# Patient Record
Sex: Female | Born: 1960 | Race: White | Hispanic: No | Marital: Married | Smoking: Never smoker
Health system: Southern US, Community
[De-identification: ages and names within clinical notes are randomized; demographics above are authoritative.]

## PROBLEM LIST (undated history)

## (undated) DIAGNOSIS — J45909 Unspecified asthma, uncomplicated: Secondary | ICD-10-CM

## (undated) DIAGNOSIS — I10 Essential (primary) hypertension: Secondary | ICD-10-CM

## (undated) DIAGNOSIS — K219 Gastro-esophageal reflux disease without esophagitis: Secondary | ICD-10-CM

## (undated) HISTORY — DX: Essential (primary) hypertension: I10

## (undated) HISTORY — PX: COLONOSCOPY: SHX174

## (undated) HISTORY — DX: Unspecified asthma, uncomplicated: J45.909

---

## 2014-05-07 ENCOUNTER — Emergency Department: Payer: Self-pay | Admitting: Internal Medicine

## 2014-05-07 LAB — CBC WITH DIFFERENTIAL/PLATELET
BASOS PCT: 1.2 %
Basophil #: 0.1 10*3/uL (ref 0.0–0.1)
EOS PCT: 2.3 %
Eosinophil #: 0.2 10*3/uL (ref 0.0–0.7)
HCT: 39.8 % (ref 35.0–47.0)
HGB: 13.4 g/dL (ref 12.0–16.0)
LYMPHS ABS: 2.7 10*3/uL (ref 1.0–3.6)
LYMPHS PCT: 29.3 %
MCH: 28.9 pg (ref 26.0–34.0)
MCHC: 33.6 g/dL (ref 32.0–36.0)
MCV: 86 fL (ref 80–100)
Monocyte #: 0.7 x10 3/mm (ref 0.2–0.9)
Monocyte %: 7.7 %
NEUTROS ABS: 5.6 10*3/uL (ref 1.4–6.5)
Neutrophil %: 59.5 %
Platelet: 299 10*3/uL (ref 150–440)
RBC: 4.63 10*6/uL (ref 3.80–5.20)
RDW: 12.4 % (ref 11.5–14.5)
WBC: 9.3 10*3/uL (ref 3.6–11.0)

## 2014-05-07 LAB — URINALYSIS, COMPLETE
Bacteria: NONE SEEN
Bilirubin,UR: NEGATIVE
Blood: NEGATIVE
Glucose,UR: NEGATIVE mg/dL (ref 0–75)
Ketone: NEGATIVE
Leukocyte Esterase: NEGATIVE
Nitrite: NEGATIVE
PH: 7 (ref 4.5–8.0)
PROTEIN: NEGATIVE
RBC,UR: <1 /HPF (ref 0–5)
SPECIFIC GRAVITY: 1.004 (ref 1.003–1.030)
Squamous Epithelial: <1
WBC UR: <1 /HPF (ref 0–5)

## 2014-05-07 LAB — COMPREHENSIVE METABOLIC PANEL
ALT: 27 U/L
ANION GAP: 8 (ref 7–16)
AST: 32 U/L (ref 15–37)
Albumin: 3.4 g/dL (ref 3.4–5.0)
Alkaline Phosphatase: 70 U/L
BUN: 8 mg/dL (ref 7–18)
Bilirubin,Total: 0.4 mg/dL (ref 0.2–1.0)
CALCIUM: 8.6 mg/dL (ref 8.5–10.1)
CHLORIDE: 102 mmol/L (ref 98–107)
Co2: 31 mmol/L (ref 21–32)
Creatinine: 0.88 mg/dL (ref 0.60–1.30)
EGFR (Non-African Amer.): >60
Glucose: 104 mg/dL — ABNORMAL HIGH (ref 65–99)
Osmolality: 280 (ref 275–301)
POTASSIUM: 3 mmol/L — AB (ref 3.5–5.1)
SODIUM: 141 mmol/L (ref 136–145)
Total Protein: 7 g/dL (ref 6.4–8.2)

## 2014-05-07 LAB — SEDIMENTATION RATE: ERYTHROCYTE SED RATE: 24 mm/h (ref 0–30)

## 2014-05-07 LAB — TROPONIN I: Troponin-I: <0.02 ng/mL

## 2015-05-09 ENCOUNTER — Ambulatory Visit: Payer: BLUE CROSS/BLUE SHIELD | Attending: Otolaryngology | Admitting: Speech Pathology

## 2015-05-09 DIAGNOSIS — R490 Dysphonia: Secondary | ICD-10-CM | POA: Insufficient documentation

## 2015-05-10 ENCOUNTER — Encounter: Payer: Self-pay | Admitting: Speech Pathology

## 2015-05-10 NOTE — Therapy (Signed)
[place] MAIN [facility] SERVICES [address] [place], [place], [address] Phone: [phone]   Fax:  [phone]  Speech Language Pathology Evaluation  Patient Details  Name: Audrey Martin MRN: [id number] Date of Birth: 08/02/1960 Referring Provider: Dr. Kathyrn Sheriff  Encounter Date: 05/09/2015      End of Session - 05/10/15 1301    Visit Number 1   Number of Visits 17   Date for SLP Re-Evaluation 07/13/15   SLP Start Time 16   SLP Stop Time  1655   SLP Time Calculation (min) 55 min   Activity Tolerance Patient tolerated treatment well      Past Medical History  Diagnosis Date   Asthma    Hypertension     History reviewed. No pertinent past surgical history.  There were no vitals filed for this visit.  Visit Diagnosis: Dysphonia - Plan: SLP plan of care cert/re-cert      Subjective Assessment - 05/10/15 1300    Subjective Patient reports hoarseness with pain along her right neck from ear to under her jaw.  She states that the pain has resolved. She has been evaluated by Dr. 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   Currently in Pain? No/denies            SLP Evaluation [facility] - 05/10/15 0001    SLP Visit Information   SLP Received On 05/09/15   Referring Provider Dr. Kathyrn Sheriff   Onset Date 05/01/2015   Subjective   Subjective Patient reports hoarseness with pain along her right neck from ear to under her jaw.  She states that the pain has resolved. She has been evaluated by Dr. 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   Patient/Family Stated Goal clear vocal quality, return to singing in the choir   Oral Motor/Sensory Function   Overall Oral Motor/Sensory Function Appears within functional limits for tasks assessed   Motor Speech   Overall Motor Speech Impaired   Respiration Impaired   Level of Impairment Conversation   Phonation Hoarse   Resonance Within functional limits   Articulation Within functional limitis   Intelligibility Intelligible   Motor Planning Witnin functional limits   Motor Speech Errors Not applicable   Phonation Impaired   Vocal Abuses Habitual Hyperphonia;Habitual Cough/Throat Clear;Glottal Attack;Vocal Fold Dehydration   Tension Present Jaw;Neck;Shoulder   Volume Appropriate   Pitch Low   Standardized Assessments   Standardized Assessments  Other Assessment  Perceptual Voice Evaluation       Perceptual Voice Evaluation  Voice history: Patient reports hoarseness with pain along her right neck from ear to under her jaw.  She states that the pain has resolved. She has been evaluated by Dr. 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  Voice checklist:  Health risks: GERD/LPR, heavy caffeine use, asthma (using inhaled corticosteroids)  Characteristic voice use: sings in church choir, talks on the phone for work, talkative by nature  Environmental risks: multiple stresses in her life  Misuse: tension  Abuse: excessive voice use during colds or other illness, frequent throat clearing  Vocal characteristics: vocal fatigue, hoarse vocal quality, lowered habitual pitch  Patient Quality of Life Survey: Voice Handicap Index-10 Score of 22  A score of 10 or higher indicates perceived handicap  Maximum phonation time for sustained ah: 5 seconds  Average fundamental frequency during sustained ah: 200 Hz (1.6 STD below average for age and gender)  Average time patient  was able to sustain /s/: 12.7 seconds  Average time patient was able to sustain /z/: 8 seconds  s/z ratio : 1.6  Visi-Pitch: Multi-Dimensional Voice Program (MDVP)  MDVP extracts objective quantitative values (Relative Average Perturbation, Shimmer, Voice Turbulence Index, and Noise to Harmonic Ratio) on sustained phonation, which are displayed graphically and numerically in comparison to a  built-in normative database.  The patient exhibited values outside the norm for Relative Average Perturbation and Voice Turbulence Index. Average fundamental frequency was 1.6 STD below the average for age and gender. The patient improved all parameters when cued to alter voicing (higher pitch with oral resonance).   Stimulability: Improved vocal quality with oral resonance and higher pitch.          SLP Education - 05/10/15 1300    Education provided Yes   Education Details vocal hygiene, LPR, and non-phonatary exercises.   Person(s) Educated Patient   Methods Explanation;Demonstration;Verbal cues;Handout   Comprehension Verbalized understanding;Returned demonstration            SLP Long Term Goals - 05/10/15 1306    SLP LONG TERM GOAL #1   Title The patient will demonstrate independent understanding of vocal hygiene concepts and neck, shoulder, lingual stretching exercises.   Time 8   Period Weeks   Status New   SLP LONG TERM GOAL #2   Title The patient will be independent for abdominal breathing and breath support exercises.   Time 8   Period Weeks   Status New   SLP LONG TERM GOAL #3   Title The patient will minimize vocal tension via Yawn-Sigh approach (or comparable technique) with min SLP cues with 80% accuracy.   Time 8   Period Weeks   Status New   SLP LONG TERM GOAL #4   Title The patient will maintain relaxed phonation / oral resonance for paragraph length recitation with 80% accuracy.   Time 8   Period Weeks   Status New          Plan - 05/10/15 1305    Clinical Impression Statement This 55 year old woman under the care of Dr. Kathyrn Sheriff, with bilateral vocal nodules, is presenting with moderate dysphonia.  The patient demonstrates hoarse vocal quality, reduced breath control for speech, strained/tense phonation, limited pitch range, and laryngeal tension. She will benefit from voice therapy for education, to improve breath support, improve tone focus,  promote easy flow phonation, and learn techniques to increase loudness and pitch range without strain.  Dr. Kathyrn Sheriff had recommended voice rest, which the patient has found difficult to maintain.  We discussed strategies to improve her vocal rest.  The patient was provided with written information regarding vocal hygiene, LPR, and non-phonatary exercises.  We will begin direct voice therapy in 2 weeks.   Speech Therapy Frequency 2x / week   Duration Other (comment)  8 weeks   Potential to Achieve Goals Good   Potential Considerations Ability to learn/carryover information;Co-morbidities;Cooperation/participation level;Medical prognosis;Pain level;Previous level of function;Severity of impairments;Family/community support   SLP Home Exercise Plan vocal hygiene, LPR, and non-phonatary exercises   Consulted and Agree with Plan of Care Patient        Problem List There are no active problems to display for this patient.  Leroy Sea, MS/CCC- SLP  Lou Miner 05/10/2015, 1:11 PM  [place] MAIN [facility] SERVICES [address] [place], [place], [address] Phone: [phone]   Fax:  [phone]  Name: Audrey Martin MRN: [id number] Date  of Birth: 1960-11-27

## 2015-05-23 ENCOUNTER — Ambulatory Visit: Payer: BLUE CROSS/BLUE SHIELD | Admitting: Speech Pathology

## 2015-05-23 DIAGNOSIS — R490 Dysphonia: Secondary | ICD-10-CM | POA: Diagnosis not present

## 2015-05-24 ENCOUNTER — Encounter: Payer: Self-pay | Admitting: Speech Pathology

## 2015-05-24 NOTE — Therapy (Signed)
[place] MAIN [facility] SERVICES [address] [place], [place], [address] Phone: [phone]   Fax:  [phone]  Speech Language Pathology Treatment  Patient Details  Name: Audrey Martin MRN: [id number] Date of Birth: 1960-05-22 Referring Provider: Dr. Kathyrn Sheriff  Encounter Date: 05/23/2015      End of Session - 05/24/15 1500    Visit Number 2   Number of Visits 17   Date for SLP Re-Evaluation 07/13/15   SLP Start Time 1105   SLP Stop Time  1157   SLP Time Calculation (min) 52 min   Activity Tolerance Patient tolerated treatment well      Past Medical History  Diagnosis Date  . Asthma   . Hypertension     History reviewed. No pertinent past surgical history.  There were no vitals filed for this visit.  Visit Diagnosis: Dysphonia      Subjective Assessment - 05/24/15 1459    Subjective The patient was able to get more voice rest since the evaluation 05/09/2015   Currently in Pain? No/denies               ADULT SLP TREATMENT - 05/24/15 0001    General Information   Behavior/Cognition Alert;Cooperative;Pleasant mood   HPI Bilateral vocal cord nodules   Treatment Provided   Treatment provided Cognitive-Linquistic   Pain Assessment   Pain Assessment No/denies pain   Cognitive-Linquistic Treatment   Treatment focused on Voice   Skilled Treatment The patient was provided with written and verbal teaching regarding vocal hygiene.  She is finding it very difficult to get any voice rest.  The patient was provided with written and verbal teaching regarding neck, shoulder, tongue, and throat stretches exercises to promote relaxed phonation.  The patient was provided with written and verbal teaching for supplement vocal tract relaxation exercises.  The patient was provided with written and verbal teaching regarding breath support exercises.  Patient instructed in relaxed phonation / oral resonance. Patient able to maintain vocal  clarity with nasality and loudness with hum and inconsistently in words, sentences, recitation, and conversation.   Assessment / Recommendations / Plan   Plan Continue with current plan of care   Progression Toward Goals   Progression toward goals Progressing toward goals          SLP Education - 05/24/15 1459    Education provided Yes   Education Details Vocal hygiene, neck, tongue, and throat stretches, trills, breath support exercises, relaxed phonation/oral resonance   Person(s) Educated Patient   Methods Explanation;Demonstration;Verbal cues;Handout   Comprehension Verbalized understanding;Returned demonstration;Verbal cues required;Need further instruction            SLP Long Term Goals - 05/10/15 1306    SLP LONG TERM GOAL #1   Title The patient will demonstrate independent understanding of vocal hygiene concepts and neck, shoulder, lingual stretching exercises.   Time 8   Period Weeks   Status New   SLP LONG TERM GOAL #2   Title The patient will be independent for abdominal breathing and breath support exercises.   Time 8   Period Weeks   Status New   SLP LONG TERM GOAL #3   Title The patient will minimize vocal tension via Yawn-Sigh approach (or comparable technique) with min SLP cues with 80% accuracy.   Time 8   Period Weeks   Status New   SLP LONG TERM GOAL #4   Title The patient will maintain relaxed phonation / oral resonance for paragraph  length recitation with 80% accuracy.   Time 8   Period Weeks   Status New          Plan - 05/24/15 1501    Clinical Impression Statement The patient is able to complete the non-phonatory exercises well.  She is able to generate a good vocal quality phonation with hum.  Patient able to inconsistently maintain vocal clarity with nasality and loudness in words, sentences, recitation, and conversation     Speech Therapy Frequency 2x / week   Duration Other (comment)   Treatment/Interventions Other (comment)  Voice  therapy   Potential to Achieve Goals Good   Potential Considerations Ability to learn/carryover information;Co-morbidities;Cooperation/participation level;Medical prognosis;Pain level;Previous level of function;Severity of impairments;Family/community support   SLP Home Exercise Plan Vocal hygiene, neck, tongue, and throat stretches, trills, breath support exercises, relaxed phonation/oral resonance   Consulted and Agree with Plan of Care Patient        Problem List There are no active problems to display for this patient.  Leroy Sea, MS/CCC- SLP  Lou Miner 05/24/2015, 3:02 PM  [place] MAIN [facility] SERVICES [address] [place], [place], [address] Phone: [phone]   Fax:  [phone]   Name: Audrey Martin MRN: [id number] Date of Birth: Sep 25, 1960

## 2015-05-28 ENCOUNTER — Ambulatory Visit: Payer: BLUE CROSS/BLUE SHIELD | Admitting: Speech Pathology

## 2015-05-29 ENCOUNTER — Ambulatory Visit: Payer: BLUE CROSS/BLUE SHIELD | Admitting: Speech Pathology

## 2015-05-29 DIAGNOSIS — R490 Dysphonia: Secondary | ICD-10-CM | POA: Diagnosis not present

## 2015-05-30 ENCOUNTER — Encounter: Payer: Self-pay | Admitting: Speech Pathology

## 2015-05-30 ENCOUNTER — Ambulatory Visit: Payer: BLUE CROSS/BLUE SHIELD | Admitting: Speech Pathology

## 2015-05-30 NOTE — Therapy (Signed)
[place] MAIN [facility] SERVICES [address] [place], [place], [address] Phone: [phone]   Fax:  [phone]  Speech Language Pathology Discharge Summary  Patient Details  Name: Audrey Martin MRN: [id number] Date of Birth: 1961-01-09 Referring Provider: Dr. Kathyrn Sheriff  Encounter Date: 05/29/2015      End of Session - 05/30/15 1259    Visit Number 3   Number of Visits 17   Date for SLP Re-Evaluation 07/13/15   SLP Start Time 1610   SLP Stop Time  1649   SLP Time Calculation (min) 39 min   Activity Tolerance Patient tolerated treatment well      Past Medical History  Diagnosis Date  . Asthma   . Hypertension     History reviewed. No pertinent past surgical history.  There were no vitals filed for this visit.  Visit Diagnosis: Dysphonia      Subjective Assessment - 05/30/15 1258    Subjective The patient reports successful use of oral resoance to maintain vocal clarity and reduce strain at the larynx.   Currently in Pain? No/denies               ADULT SLP TREATMENT - 05/30/15 0001    General Information   Behavior/Cognition Alert;Cooperative;Pleasant mood   HPI Bilateral vocal cord nodules   Treatment Provided   Treatment provided Cognitive-Linquistic   Pain Assessment   Pain Assessment No/denies pain   Cognitive-Linquistic Treatment   Treatment focused on Voice   Skilled Treatment The patient was provided with written and verbal teaching regarding vocal hygiene.  She is finding it easier to get voice rest.  The patient was provided with written and verbal teaching regarding neck, shoulder, tongue, and throat stretches exercises to promote relaxed phonation.  The patient was provided with written and verbal teaching for supplement vocal tract relaxation exercises.  The patient is under stress and finds these exercises helpful in avoiding putting her stress on her larynx.  The patient was provided with written and verbal  teaching regarding breath support exercises.  Patient instructed in relaxed phonation / oral resonance. Patient able to maintain vocal clarity with oral resonance in words, sentences, recitation, and conversation.   Assessment / Recommendations / Plan   Plan Discharge SLP treatment due to (comment);All goals met   Progression Toward Goals   Progression toward goals Goals met, education completed, patient discharged from [facility] - 05/30/15 1259    Education provided Yes   Education Details Vocal hygiene, neck, tongue, and throat stretches, trills, breath support exercises, relaxed phonation/oral resonance   Person(s) Educated Patient   Methods Explanation;Demonstration;Handout   Comprehension Verbalized understanding;Returned demonstration            SLP Long Term Goals - 05/30/15 1300    SLP LONG TERM GOAL #1   Title The patient will demonstrate independent understanding of vocal hygiene concepts and neck, shoulder, lingual stretching exercises.   Status Achieved   SLP LONG TERM GOAL #2   Title The patient will be independent for abdominal breathing and breath support exercises.   Status Achieved   SLP LONG TERM GOAL #3   Title The patient will minimize vocal tension via Yawn-Sigh approach (or comparable technique) with min SLP cues with 80% accuracy.   Status Achieved   SLP LONG TERM GOAL #4   Title The patient will maintain relaxed phonation / oral resonance for paragraph length recitation with 80%  accuracy.   Status Achieved          Plan - 05/30/15 1300    Clinical Impression Statement The patient has met her goals and is maintaining clear vocal quality across settings with good vocal stamina.  The patient is able to generate a good vocal quality phonation with oral resonance.  Patient able to maintain vocal clarity with oral resonance in words, sentences, recitation, and conversation.   Speech Therapy Frequency Other (comment)  Discharge    Potential to Achieve Goals Good   Potential Considerations Ability to learn/carryover information;Co-morbidities;Cooperation/participation level;Medical prognosis;Pain level;Previous level of function;Severity of impairments;Family/community support   SLP Home Exercise Plan Vocal hygiene, neck, tongue, and throat stretches, trills, breath support exercises, relaxed phonation/oral resonance   Consulted and Agree with Plan of Care Patient        Problem List There are no active problems to display for this patient.  Leroy Sea, Gibson, Susie 05/30/2015, 1:01 PM  [place] MAIN [facility] SERVICES [address] [place], [place], [address] Phone: [phone]   Fax:  [phone]   Name: Audrey Martin MRN: [id number] Date of Birth: 18-Jan-1961

## 2015-06-01 ENCOUNTER — Ambulatory Visit: Payer: BLUE CROSS/BLUE SHIELD | Admitting: Speech Pathology

## 2015-06-04 ENCOUNTER — Ambulatory Visit: Payer: BLUE CROSS/BLUE SHIELD | Admitting: Speech Pathology

## 2015-06-05 ENCOUNTER — Ambulatory Visit: Payer: BLUE CROSS/BLUE SHIELD | Admitting: Speech Pathology

## 2015-06-07 ENCOUNTER — Ambulatory Visit: Payer: BLUE CROSS/BLUE SHIELD | Admitting: Speech Pathology

## 2015-06-12 ENCOUNTER — Ambulatory Visit: Payer: BLUE CROSS/BLUE SHIELD | Admitting: Speech Pathology

## 2015-06-14 ENCOUNTER — Ambulatory Visit: Payer: BLUE CROSS/BLUE SHIELD | Admitting: Speech Pathology

## 2015-06-19 ENCOUNTER — Ambulatory Visit: Payer: BLUE CROSS/BLUE SHIELD | Admitting: Speech Pathology

## 2015-06-21 ENCOUNTER — Ambulatory Visit: Payer: BLUE CROSS/BLUE SHIELD | Admitting: Speech Pathology

## 2015-11-17 IMAGING — CT CT HEAD WITHOUT CONTRAST
1 series · 16 of 30 positions shown, 20 images · non-contrast
Comparison: None.

CLINICAL DATA: Headache.  Hypertension.  Additional evaluation.

EXAM:
CT HEAD WITHOUT CONTRAST
TECHNIQUE: Contiguous axial images were obtained from the base of the skull
through the vertex without intravenous contrast.

[Series 2: head wo · axial · 0.39mm/px · z∈[-127,-1]mm · 16 of 32 slices shown, 20 images]
[im 2/32  brain]
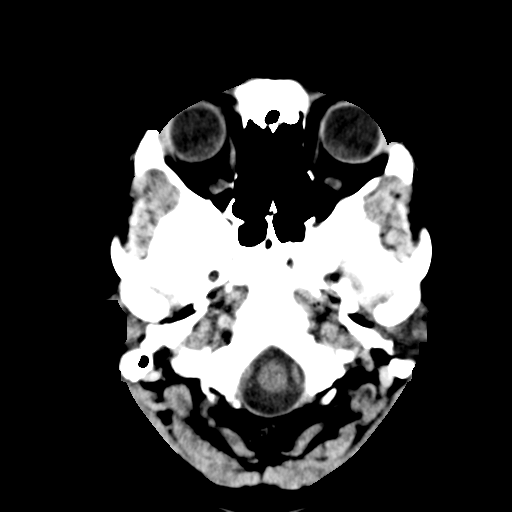
[im 2/32  bone]
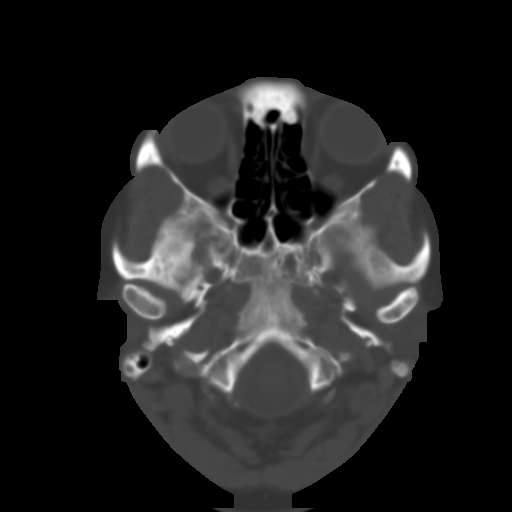
[im 4/32  brain]
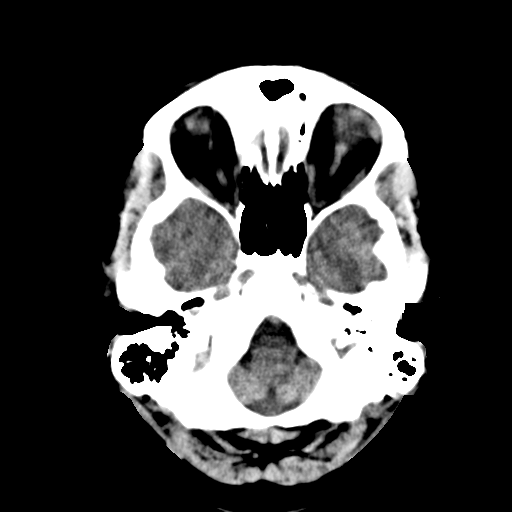
[im 6/32  brain]
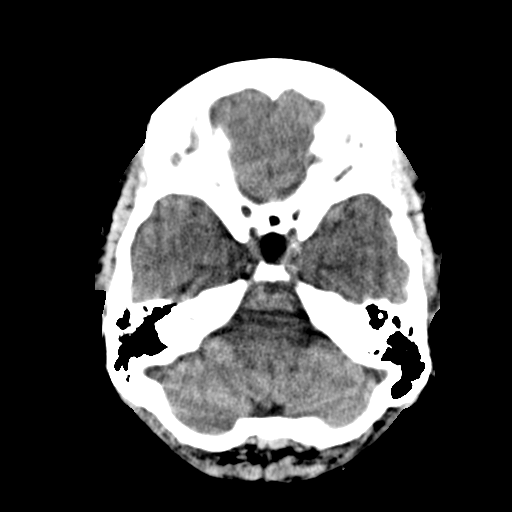
[im 8/32  brain]
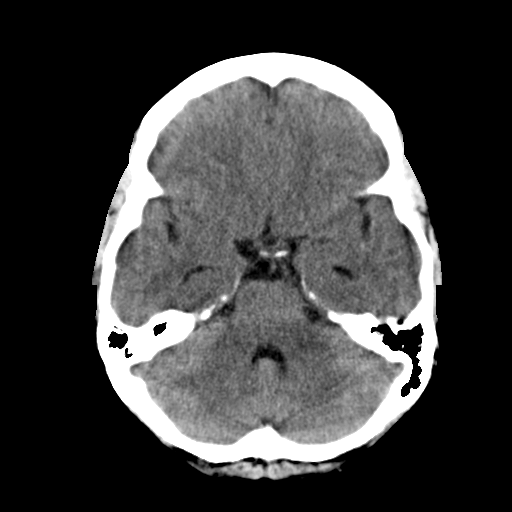
[im 9/32  brain]
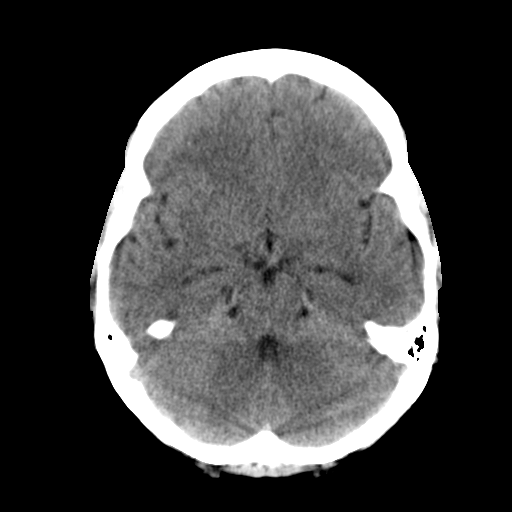
[im 9/32  bone]
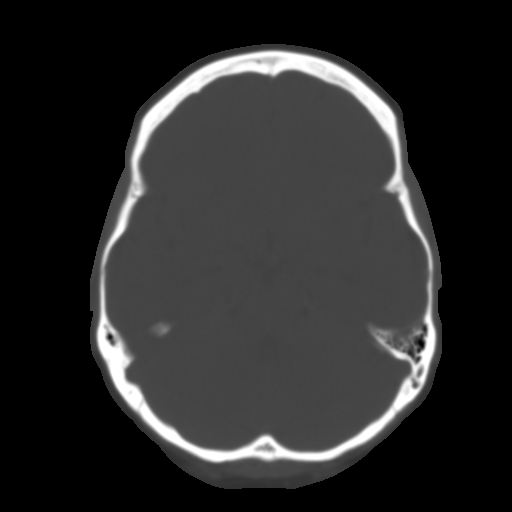
[im 11/32  brain]
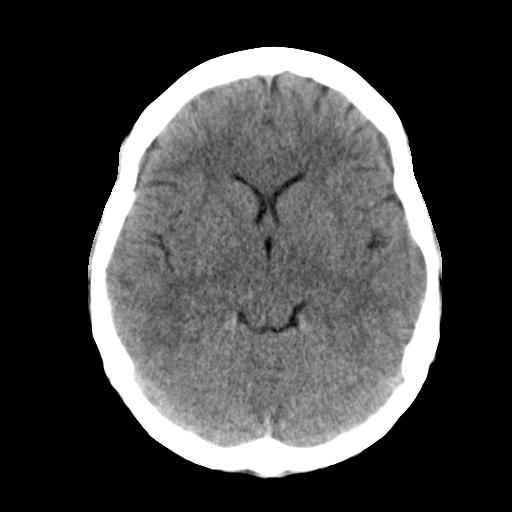
[im 13/32  brain]
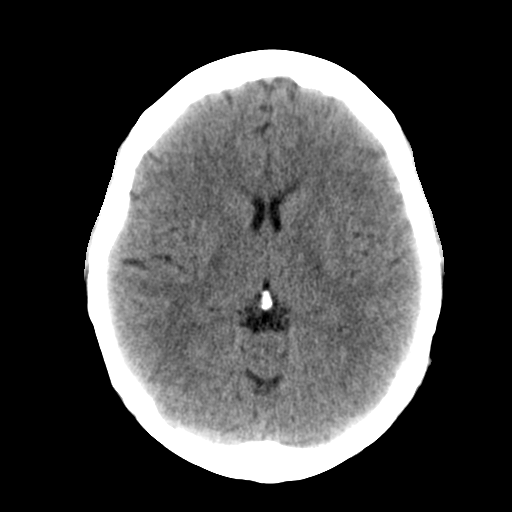
[im 15/32  brain]
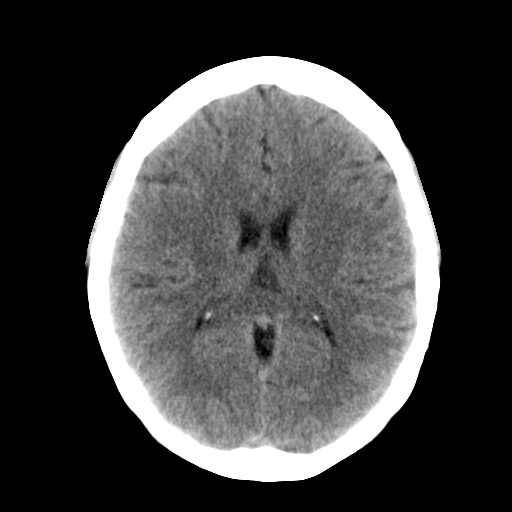
[im 17/32  brain]
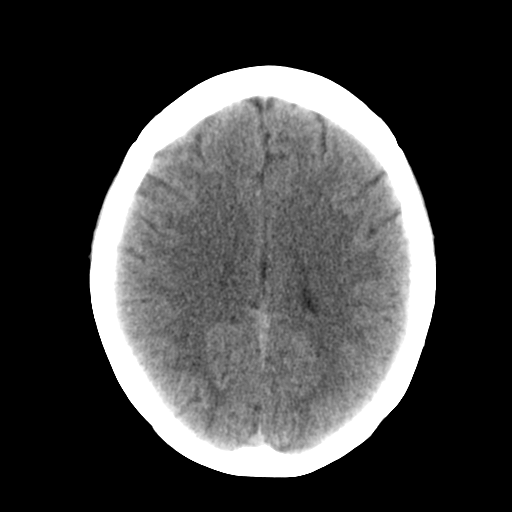
[im 17/32  bone]
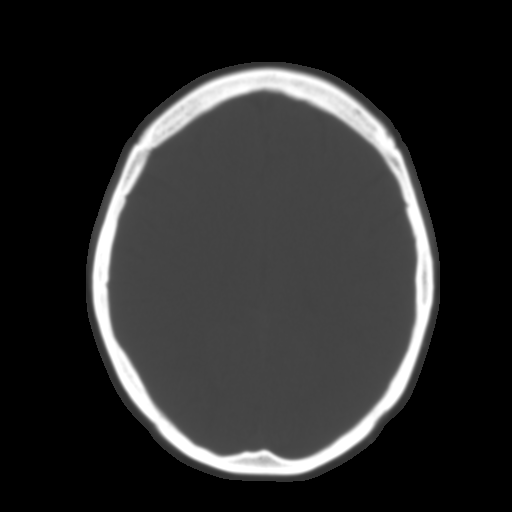
[im 19/32  brain]
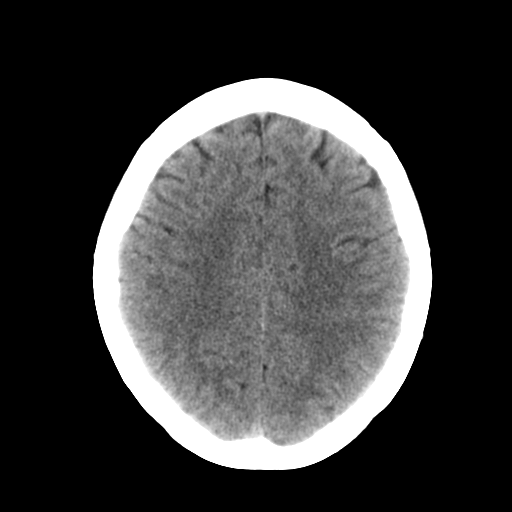
[im 21/32  brain]
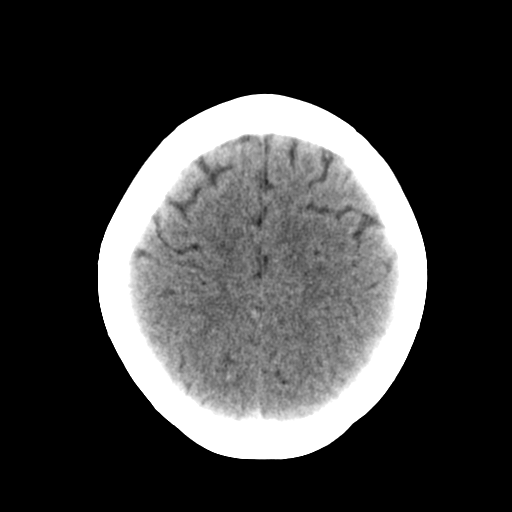
[im 23/32  brain]
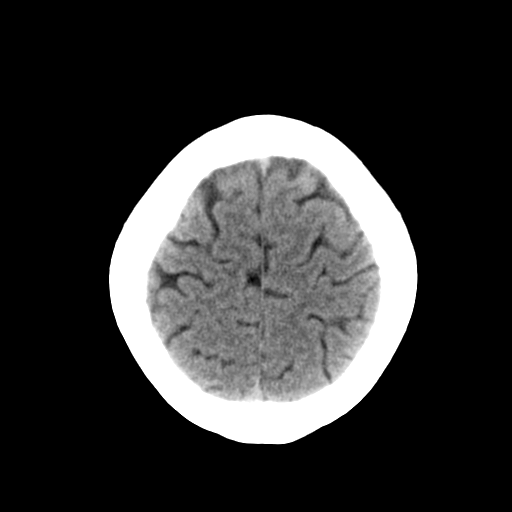
[im 24/32  brain]
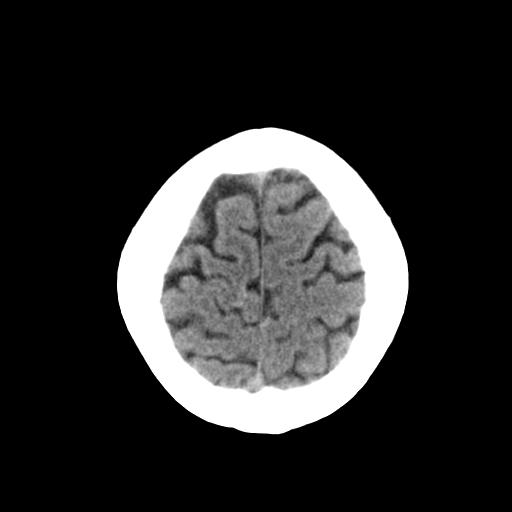
[im 24/32  bone]
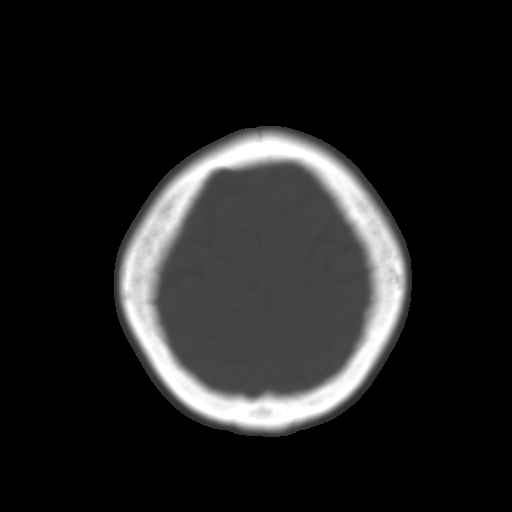
[im 26/32  brain]
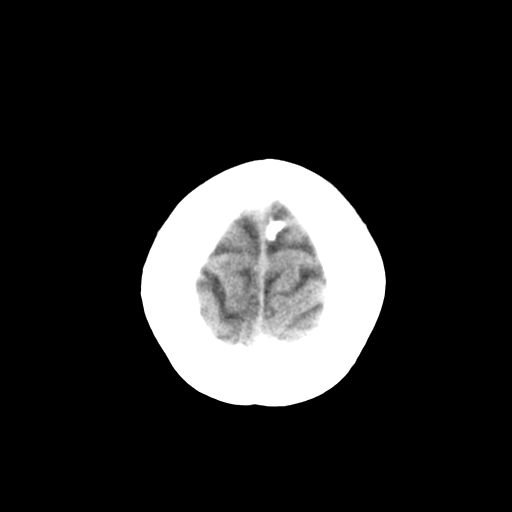
[im 28/32  brain]
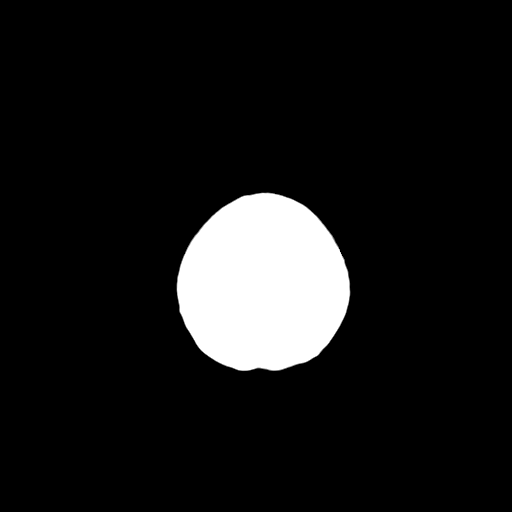
[im 30/32  brain]
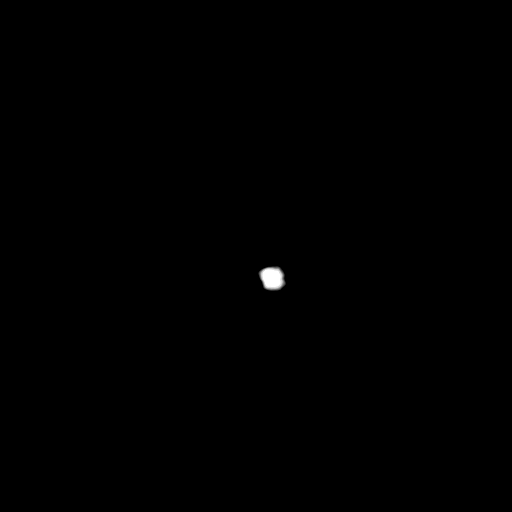

[16 of 30 positions shown; findings below may reference images not displayed]

FINDINGS: There is no acute intracranial hemorrhage or infarct. No mass lesion
or midline shift. Gray-white matter differentiation is well
maintained. Ventricles are normal in size without evidence of
hydrocephalus. CSF containing spaces are within normal limits. No
extra-axial fluid collection.

The calvarium is intact.

Orbital soft tissues are within normal limits.

The paranasal sinuses and mastoid air cells are well pneumatized and
free of fluid.

Scalp soft tissues are unremarkable.
IMPRESSION: Normal head CT with no acute intracranial process identified.

## 2016-05-09 ENCOUNTER — Ambulatory Visit
Admission: RE | Admit: 2016-05-09 | Discharge: 2016-05-09 | Disposition: A | Discharge status: Home / Self Care | Payer: Managed Care, Other (non HMO) | Source: Ambulatory Visit | Attending: Family Medicine | Admitting: Family Medicine

## 2016-05-09 ENCOUNTER — Other Ambulatory Visit: Payer: Self-pay | Admitting: Family Medicine

## 2016-05-09 DIAGNOSIS — R05 Cough: Secondary | ICD-10-CM | POA: Insufficient documentation

## 2016-05-09 DIAGNOSIS — R059 Cough, unspecified: Secondary | ICD-10-CM

## 2021-11-08 ENCOUNTER — Other Ambulatory Visit: Payer: Self-pay | Admitting: Physician Assistant

## 2021-11-08 DIAGNOSIS — Z1231 Encounter for screening mammogram for malignant neoplasm of breast: Secondary | ICD-10-CM

## 2021-11-14 ENCOUNTER — Telehealth: Payer: Self-pay

## 2021-11-14 NOTE — Telephone Encounter (Signed)
CALLED PATIENT NO ANSWER LEFT VOICEMAIL FOR A CALL BACK ? ?

## 2021-11-15 ENCOUNTER — Telehealth: Payer: Self-pay

## 2021-11-15 NOTE — Telephone Encounter (Signed)
CALLED PATIENT NO ANSWER LEFT VOICEMAIL FOR A CALL BACK °Letter sent °

## 2022-05-19 ENCOUNTER — Ambulatory Visit: Payer: BLUE CROSS/BLUE SHIELD | Admitting: Dermatology

## 2022-06-23 ENCOUNTER — Telehealth: Payer: Self-pay | Admitting: Gastroenterology

## 2022-06-23 NOTE — Telephone Encounter (Signed)
Pt called to schedule colonoscopy call-[phone]

## 2022-06-24 NOTE — Telephone Encounter (Signed)
Message left for patient to return my call.  

## 2022-06-25 ENCOUNTER — Telehealth: Payer: Self-pay | Admitting: *Deleted

## 2022-06-25 ENCOUNTER — Other Ambulatory Visit: Payer: Self-pay | Admitting: *Deleted

## 2022-06-25 ENCOUNTER — Encounter: Payer: Self-pay | Admitting: *Deleted

## 2022-06-25 ENCOUNTER — Other Ambulatory Visit: Payer: Self-pay | Admitting: Family Medicine

## 2022-06-25 DIAGNOSIS — Z1211 Encounter for screening for malignant neoplasm of colon: Secondary | ICD-10-CM

## 2022-06-25 DIAGNOSIS — Z1231 Encounter for screening mammogram for malignant neoplasm of breast: Secondary | ICD-10-CM

## 2022-06-25 MED ORDER — NA SULFATE-K SULFATE-MG SULF 17.5-3.13-1.6 GM/177ML PO SOLN: 1.0000 | Freq: Once | ORAL | 0 refills | Status: AC

## 2022-06-25 NOTE — Telephone Encounter (Signed)
This patient have been schedule with Dr Vicente Males because she would like her colonoscopy done before the end of the month since she will not insurance by then.

## 2022-06-25 NOTE — Telephone Encounter (Signed)
This patient have been schedule with Dr Vicente Males because she would like her colonoscopy done before the end of the month since she will not insurance by then.   Patient will come Friday or have husband come by the office to pick up instructions.

## 2022-06-25 NOTE — Telephone Encounter (Signed)
Message left for patient to return my call.  

## 2022-06-25 NOTE — Telephone Encounter (Signed)
Gastroenterology Pre-Procedure Review  Request Date: 07/02/2022 Requesting Physician: Dr. Vicente Males  PATIENT REVIEW QUESTIONS: The patient responded to the following health history questions as indicated:    1. Are you having any GI issues? no 2. Do you have a personal history of Polyps? no 3. Do you have a family history of Colon Cancer or Polyps? yes (father had colon cancer) 4. Diabetes Mellitus? no 5. Joint replacements in the past 12 months?no 6. Major health problems in the past 3 months?no 7. Any artificial heart valves, MVP, or defibrillator?no    MEDICATIONS & ALLERGIES:    Patient reports the following regarding taking any anticoagulation/antiplatelet therapy:   Plavix, Coumadin, Eliquis, Xarelto, Lovenox, Pradaxa, Brilinta, or Effient? no Aspirin? no  Patient confirms/reports the following medications:  Current Outpatient Medications  Medication Sig Dispense Refill   Na Sulfate-K Sulfate-Mg Sulf 17.5-3.13-1.6 GM/177ML SOLN Take 1 kit by mouth once for 1 dose. 354 mL 0   albuterol (PROVENTIL HFA;VENTOLIN HFA) 108 (90 Base) MCG/ACT inhaler Inhale into the lungs every 6 (six) hours as needed for wheezing or shortness of breath.     budesonide-formoterol (SYMBICORT) 160-4.5 MCG/ACT inhaler Inhale 2 puffs into the lungs 2 (two) times daily.     esomeprazole (NEXIUM) 20 MG capsule Take 20 mg by mouth daily at 12 noon.     fexofenadine (ALLEGRA) 180 MG tablet Take 180 mg by mouth daily.     fluticasone (FLONASE) 50 MCG/ACT nasal spray Place into both nostrils daily.     hydrochlorothiazide (HYDRODIURIL) 25 MG tablet Take 25 mg by mouth daily.     losartan (COZAAR) 50 MG tablet Take 50 mg by mouth daily.     montelukast (SINGULAIR) 10 MG tablet Take 10 mg by mouth at bedtime.     phentermine 37.5 MG capsule Take 37.5 mg by mouth every morning.     No current facility-administered medications for this visit.    Patient confirms/reports the following allergies:  Allergies  Allergen  Reactions   Oxycodone-Acetaminophen    Penicillins     No orders of the defined types were placed in this encounter.   AUTHORIZATION INFORMATION Primary Insurance: 1D#: Group #:  Secondary Insurance: 1D#: Group #:  SCHEDULE INFORMATION: Date: 07/02/2022 Time: Location: [place]

## 2022-07-01 ENCOUNTER — Encounter: Payer: Self-pay | Admitting: Gastroenterology

## 2022-07-02 ENCOUNTER — Encounter: Payer: Self-pay | Admitting: Gastroenterology

## 2022-07-02 ENCOUNTER — Ambulatory Visit
Admission: RE | Admit: 2022-07-02 | Discharge: 2022-07-02 | Disposition: A | Discharge status: Home / Self Care | Payer: Managed Care, Other (non HMO) | Attending: Gastroenterology | Admitting: Gastroenterology

## 2022-07-02 ENCOUNTER — Encounter
Admission: RE | Disposition: A | Discharge status: Home / Self Care | Payer: Self-pay | Source: Home / Self Care | Attending: Gastroenterology

## 2022-07-02 ENCOUNTER — Ambulatory Visit: Payer: Managed Care, Other (non HMO) | Admitting: General Practice

## 2022-07-02 ENCOUNTER — Other Ambulatory Visit: Payer: Self-pay

## 2022-07-02 DIAGNOSIS — I10 Essential (primary) hypertension: Secondary | ICD-10-CM | POA: Diagnosis not present

## 2022-07-02 DIAGNOSIS — D126 Benign neoplasm of colon, unspecified: Secondary | ICD-10-CM

## 2022-07-02 DIAGNOSIS — J45909 Unspecified asthma, uncomplicated: Secondary | ICD-10-CM | POA: Diagnosis not present

## 2022-07-02 DIAGNOSIS — D123 Benign neoplasm of transverse colon: Secondary | ICD-10-CM | POA: Diagnosis not present

## 2022-07-02 DIAGNOSIS — D122 Benign neoplasm of ascending colon: Secondary | ICD-10-CM | POA: Insufficient documentation

## 2022-07-02 DIAGNOSIS — Z1211 Encounter for screening for malignant neoplasm of colon: Secondary | ICD-10-CM | POA: Diagnosis present

## 2022-07-02 DIAGNOSIS — Z800 Family history of malignant neoplasm of digestive organs: Secondary | ICD-10-CM | POA: Diagnosis not present

## 2022-07-02 DIAGNOSIS — D125 Benign neoplasm of sigmoid colon: Secondary | ICD-10-CM | POA: Insufficient documentation

## 2022-07-02 DIAGNOSIS — K219 Gastro-esophageal reflux disease without esophagitis: Secondary | ICD-10-CM | POA: Diagnosis not present

## 2022-07-02 HISTORY — DX: Gastro-esophageal reflux disease without esophagitis: K21.9

## 2022-07-02 HISTORY — PX: COLONOSCOPY WITH PROPOFOL: SHX5780

## 2022-07-02 SURGERY — COLONOSCOPY WITH PROPOFOL
Anesthesia: General

## 2022-07-02 MED ORDER — IPRATROPIUM-ALBUTEROL 0.5-2.5 (3) MG/3ML IN SOLN
RESPIRATORY_TRACT | Status: AC
  Administered 2022-07-02: 3 mL via RESPIRATORY_TRACT
  Filled 2022-07-02: qty 3

## 2022-07-02 MED ORDER — PROPOFOL 10 MG/ML IV BOLUS
INTRAVENOUS | Status: AC
  Filled 2022-07-02: qty 20

## 2022-07-02 MED ORDER — SODIUM CHLORIDE 0.9 % IV SOLN: INTRAVENOUS | Status: DC

## 2022-07-02 MED ORDER — IPRATROPIUM-ALBUTEROL 0.5-2.5 (3) MG/3ML IN SOLN: 3.0000 mL | Freq: Once | RESPIRATORY_TRACT | Status: AC

## 2022-07-02 MED ORDER — LIDOCAINE HCL (CARDIAC) PF 100 MG/5ML IV SOSY
PREFILLED_SYRINGE | INTRAVENOUS | Status: DC | PRN
  Administered 2022-07-02: 100 mg via INTRAVENOUS

## 2022-07-02 MED ORDER — PROPOFOL 10 MG/ML IV BOLUS
INTRAVENOUS | Status: AC
  Filled 2022-07-02: qty 40

## 2022-07-02 MED ORDER — PROPOFOL 10 MG/ML IV BOLUS
INTRAVENOUS | Status: DC | PRN
  Administered 2022-07-02: 150 ug/kg/min via INTRAVENOUS
  Administered 2022-07-02: 100 mg via INTRAVENOUS

## 2022-07-02 MED ORDER — PHENYLEPHRINE 80 MCG/ML (10ML) SYRINGE FOR IV PUSH (FOR BLOOD PRESSURE SUPPORT)
PREFILLED_SYRINGE | INTRAVENOUS | Status: AC
  Filled 2022-07-02: qty 10

## 2022-07-02 MED ORDER — LIDOCAINE HCL (PF) 2 % IJ SOLN
INTRAMUSCULAR | Status: AC
  Filled 2022-07-02: qty 5

## 2022-07-02 NOTE — Anesthesia Postprocedure Evaluation (Signed)
Anesthesia Post Note  Patient: Audrey Martin  Procedure(s) Performed: COLONOSCOPY WITH PROPOFOL  Patient location during evaluation: Endoscopy Anesthesia Type: General Level of consciousness: awake and alert Pain management: pain level controlled Vital Signs Assessment: post-procedure vital signs reviewed and stable Respiratory status: spontaneous breathing, nonlabored ventilation, respiratory function stable and patient connected to nasal cannula oxygen Cardiovascular status: blood pressure returned to baseline and stable Postop Assessment: no apparent nausea or vomiting Anesthetic complications: no  No notable events documented.   Last Vitals:  Vitals:   07/02/22 0857 07/02/22 0917  BP: 110/66 126/83  Pulse: 80   Resp: 14   Temp: (!) 36.3 C   SpO2: 100%     Last Pain:  Vitals:   07/02/22 0917  TempSrc:   PainSc: 0-No pain                 Dimas Millin

## 2022-07-02 NOTE — Op Note (Signed)
[facility] Gastroenterology Patient Name: Audrey Martin Procedure Date: 07/02/2022 8:29 AM MRN: [id number] Account #: [id number] Date of Birth: Dec 13, 1960 Admit Type: Outpatient Age: 62 Room: [facility] ENDO ROOM 3 Gender: Female Note Status: Finalized Instrument Name: Jasper Riling [id number] Procedure:             Colonoscopy Indications:           Screening in patient at increased risk: Family history                         of 1st-degree relative with colorectal cancer Providers:             Jonathon Bellows MD, MD Referring MD:          Lynnell Jude (Referring MD) Medicines:             Monitored Anesthesia Care Complications:         No immediate complications. Procedure:             Pre-Anesthesia Assessment:                        - Prior to the procedure, a History and Physical was                         performed, and patient medications, allergies and                         sensitivities were reviewed. The patient's tolerance                         of previous anesthesia was reviewed.                        - The risks and benefits of the procedure and the                         sedation options and risks were discussed with the                         patient. All questions were answered and informed                         consent was obtained.                        - ASA Grade Assessment: II - A patient with mild                         systemic disease.                        After obtaining informed consent, the colonoscope was                         passed under direct vision. Throughout the procedure,                         the patient's blood pressure, pulse, and oxygen  saturations were monitored continuously. The                         Colonoscope was introduced through the anus and                         advanced to the the cecum, identified by the                         appendiceal orifice. The colonoscopy was performed                          with ease. The patient tolerated the procedure well.                         The quality of the bowel preparation was good. The                         ileocecal valve, appendiceal orifice, and rectum were                         photographed. Findings:      The perianal and digital rectal examinations were normal.      Four sessile polyps were found in the sigmoid colon. The polyps were 5       to 7 mm in size. These polyps were removed with a cold snare. Resection       and retrieval were complete.      Three sessile polyps were found in the ascending colon. The polyps were       7 to 9 mm in size. These polyps were removed with a cold snare.       Resection and retrieval were complete.      Two sessile polyps were found in the transverse colon. The polyps were 5       to 7 mm in size. These polyps were removed with a cold snare. Resection       and retrieval were complete.      The exam was otherwise without abnormality on direct and retroflexion       views. Impression:            - Four 5 to 7 mm polyps in the sigmoid colon, removed                         with a cold snare. Resected and retrieved.                        - Three 7 to 9 mm polyps in the ascending colon,                         removed with a cold snare. Resected and retrieved.                        - Two 5 to 7 mm polyps in the transverse colon,                         removed with a cold snare. Resected and retrieved.                        -  The examination was otherwise normal on direct and                         retroflexion views. Recommendation:        - Discharge patient to home (with escort).                        - Resume previous diet.                        - Continue present medications.                        - Await pathology results.                        - Repeat colonoscopy in 1 year for surveillance. Procedure Code(s):     --- Professional ---                         [phone], Colonoscopy, flexible; with removal of                         tumor(s), polyp(s), or other lesion(s) by snare                         technique Diagnosis Code(s):     --- Professional ---                        Z80.0, Family history of malignant neoplasm of                         digestive organs                        D12.5, Benign neoplasm of sigmoid colon                        D12.2, Benign neoplasm of ascending colon                        D12.3, Benign neoplasm of transverse colon (hepatic                         flexure or splenic flexure) CPT copyright 2022 American Medical Association. All rights reserved. The codes documented in this report are preliminary and upon coder review may  be revised to meet current compliance requirements. Jonathon Bellows, MD Jonathon Bellows MD, MD 07/02/2022 8:56:21 AM This report has been signed electronically. Number of Addenda: 0 Note Initiated On: 07/02/2022 8:29 AM Scope Withdrawal Time: 0 hours 15 minutes 6 seconds  Total Procedure Duration: 0 hours 19 minutes 32 seconds  Estimated Blood Loss:  Estimated blood loss: none.      [facility]

## 2022-07-02 NOTE — H&P (Signed)
Jonathon Bellows, MD [address] [place], [place], [place], [address] [address], [place], [place], [place], [address] Phone: [phone]  Fax: [phone]  Primary Care Physician:  Lynnell Jude, MD   Pre-Procedure History & Physical: HPI:  Audrey Martin is a 61 y.o. female is here for an colonoscopy.   Past Medical History:  Diagnosis Date   Asthma    GERD (gastroesophageal reflux disease)    Hypertension     Past Surgical History:  Procedure Laterality Date   COLONOSCOPY     COLONOSCOPY      Prior to Admission medications   Medication Sig Start Date End Date Taking? Authorizing Provider  ADVAIR DISKUS 250-50 MCG/ACT AEPB Inhale 1 puff into the lungs 2 (two) times daily.   Yes [provider]  budesonide-formoterol (SYMBICORT) 160-4.5 MCG/ACT inhaler Inhale 2 puffs into the lungs 2 (two) times daily.   Yes [provider]  famotidine (PEPCID) 40 MG tablet Take 40 mg by mouth 2 (two) times daily. 05/25/22  Yes [provider]  hydrochlorothiazide (HYDRODIURIL) 25 MG tablet Take 25 mg by mouth daily.   Yes [provider]  losartan (COZAAR) 50 MG tablet Take 50 mg by mouth daily.   Yes [provider]  montelukast (SINGULAIR) 10 MG tablet Take 10 mg by mouth at bedtime.   Yes [provider]  albuterol (PROVENTIL HFA;VENTOLIN HFA) 108 (90 Base) MCG/ACT inhaler Inhale into the lungs every 6 (six) hours as needed for wheezing or shortness of breath.    [provider]  esomeprazole (NEXIUM) 20 MG capsule Take 20 mg by mouth daily at 12 noon.    [provider]  fexofenadine (ALLEGRA) 180 MG tablet Take 180 mg by mouth daily.    [provider]  fluticasone (FLONASE) 50 MCG/ACT nasal spray Place into both nostrils daily.    [provider]  phentermine 37.5 MG capsule Take 37.5 mg by mouth every morning.    [provider]    Allergies as of 06/25/2022 - Review Complete  05/30/2015  Allergen Reaction Noted   Oxycodone-acetaminophen  05/10/2015   Penicillins  05/10/2015    History reviewed. No pertinent family history.  Social History   Socioeconomic History   Marital status: Married    Spouse name: Not on file   Number of children: Not on file   Years of education: Not on file   Highest education level: Not on file  Occupational History   Not on file  Tobacco Use   Smoking status: Never   Smokeless tobacco: Not on file  Vaping Use   Vaping Use: Never used  Substance and Sexual Activity   Alcohol use: Yes    Alcohol/week: 1.0 standard drink of alcohol    Types: 1 Standard drinks or equivalent per week    Comment: qfriday   Drug use: Not on file   Sexual activity: Not on file  Other Topics Concern   Not on file  Social History Narrative   Not on file   Social Determinants of Health   Financial Resource Strain: Not on file  Food Insecurity: Not on file  Transportation Needs: Not on file  Physical Activity: Not on file  Stress: Not on file  Social Connections: Not on file  Intimate Partner Violence: Not on file    Review of Systems: See HPI, otherwise negative ROS  Physical Exam: BP (!) 123/93   Pulse 83   Resp 16  Ht '5\' 1"'$  (1.549 m)   Wt 73.1 kg   SpO2 99%   BMI 30.46 kg/m  General:   Alert,  pleasant and cooperative in NAD Head:  Normocephalic and atraumatic. Neck:  Supple; no masses or thyromegaly. Lungs:  Clear throughout to auscultation, normal respiratory effort.    Heart:  +S1, +S2, Regular rate and rhythm, No edema. Abdomen:  Soft, nontender and nondistended. Normal bowel sounds, without guarding, and without rebound.   Neurologic:  Alert and  oriented x4;  grossly normal neurologically.  Impression/Plan: Audrey Martin is here for an colonoscopy to be performed for Screening colonoscopy average risk   Risks, benefits, limitations, and alternatives regarding  colonoscopy have been reviewed with the patient.   Questions have been answered.  All parties agreeable.   Jonathon Bellows, MD  07/02/2022, 8:05 AM

## 2022-07-02 NOTE — Transfer of Care (Signed)
Immediate Anesthesia Transfer of Care Note  Patient: Audrey Martin  Procedure(s) Performed: COLONOSCOPY WITH PROPOFOL  Patient Location: PACU  Anesthesia Type:General  Level of Consciousness: drowsy  Airway & Oxygen Therapy: Patient Spontanous Breathing  Post-op Assessment: Report given to RN and Post -op Vital signs reviewed and stable  Post vital signs: Reviewed and stable  Last Vitals:  Vitals Value Taken Time  BP 110/66 07/02/22 0857  Temp 35.8 0857  Pulse 80 07/02/22 0857  Resp 14 07/02/22 0857  SpO2 100 % 07/02/22 0857  Vitals shown include unvalidated device data.  Last Pain:  Vitals:   07/02/22 0715  TempSrc: Temporal         Complications: No notable events documented.

## 2022-07-02 NOTE — Anesthesia Preprocedure Evaluation (Signed)
Anesthesia Evaluation  Patient identified by MRN, date of birth, ID band Patient awake    Reviewed: Allergy & Precautions, NPO status , Patient's Chart, lab work & pertinent test results  Airway Mallampati: III  TM Distance: >3 FB Neck ROM: full    Dental  (+) Chipped, Dental Advidsory Given   Pulmonary neg shortness of breath, asthma    Pulmonary exam normal        Cardiovascular hypertension, (-) Past MI and (-) CABG negative cardio ROS Normal cardiovascular exam     Neuro/Psych negative neurological ROS  negative psych ROS   GI/Hepatic Neg liver ROS,GERD  ,,  Endo/Other  negative endocrine ROS    Renal/GU negative Renal ROS  negative genitourinary   Musculoskeletal   Abdominal   Peds  Hematology negative hematology ROS (+)   Anesthesia Other Findings Past Medical History: No date: Asthma No date: GERD (gastroesophageal reflux disease) No date: Hypertension  Past Surgical History: No date: COLONOSCOPY No date: COLONOSCOPY  BMI    Body Mass Index: 30.46 kg/m      Reproductive/Obstetrics negative OB ROS                             Anesthesia Physical Anesthesia Plan  ASA: 2  Anesthesia Plan: General   Post-op Pain Management: Minimal or no pain anticipated   Induction: Intravenous  PONV Risk Score and Plan: 3 and Propofol infusion, TIVA and Ondansetron  Airway Management Planned: Nasal Cannula  Additional Equipment: None  Intra-op Plan:   Post-operative Plan:   Informed Consent: I have reviewed the patients History and Physical, chart, labs and discussed the procedure including the risks, benefits and alternatives for the proposed anesthesia with the patient or authorized representative who has indicated his/her understanding and acceptance.     Dental advisory given  Plan Discussed with: CRNA and Surgeon  Anesthesia Plan Comments: (Discussed risks of  anesthesia with patient, including possibility of difficulty with spontaneous ventilation under anesthesia necessitating airway intervention, PONV, and rare risks such as cardiac or respiratory or neurological events, and allergic reactions. Discussed the role of CRNA in patient's perioperative care. Patient understands.)       Anesthesia Quick Evaluation

## 2022-07-03 ENCOUNTER — Ambulatory Visit
Admission: RE | Admit: 2022-07-03 | Discharge: 2022-07-03 | Disposition: A | Discharge status: Home / Self Care | Payer: Managed Care, Other (non HMO) | Source: Ambulatory Visit | Attending: Family Medicine | Admitting: Family Medicine

## 2022-07-03 DIAGNOSIS — Z1231 Encounter for screening mammogram for malignant neoplasm of breast: Secondary | ICD-10-CM | POA: Diagnosis present

## 2022-07-03 LAB — SURGICAL PATHOLOGY

## 2022-07-04 ENCOUNTER — Encounter: Payer: Self-pay | Admitting: Gastroenterology

## 2022-07-08 ENCOUNTER — Inpatient Hospital Stay
Admission: RE | Admit: 2022-07-08 | Discharge: 2022-07-08 | Disposition: A | Discharge status: Home / Self Care | Payer: Self-pay | Source: Ambulatory Visit | Attending: *Deleted | Admitting: *Deleted

## 2022-07-08 ENCOUNTER — Other Ambulatory Visit: Payer: Self-pay | Admitting: *Deleted

## 2022-07-08 DIAGNOSIS — Z1231 Encounter for screening mammogram for malignant neoplasm of breast: Secondary | ICD-10-CM

## 2022-11-26 ENCOUNTER — Other Ambulatory Visit: Payer: Self-pay

## 2022-11-26 ENCOUNTER — Emergency Department
Admission: EM | Admit: 2022-11-26 | Discharge: 2022-11-26 | Disposition: A | Discharge status: Home / Self Care | Payer: Managed Care, Other (non HMO) | Attending: Emergency Medicine | Admitting: Emergency Medicine

## 2022-11-26 DIAGNOSIS — J45909 Unspecified asthma, uncomplicated: Secondary | ICD-10-CM | POA: Insufficient documentation

## 2022-11-26 DIAGNOSIS — I10 Essential (primary) hypertension: Secondary | ICD-10-CM | POA: Insufficient documentation

## 2022-11-26 DIAGNOSIS — R55 Syncope and collapse: Secondary | ICD-10-CM | POA: Insufficient documentation

## 2022-11-26 LAB — BASIC METABOLIC PANEL
Anion gap: 8 (ref 5–15)
BUN: 15 mg/dL (ref 6–20)
CO2: 27 mmol/L (ref 22–32)
Calcium: 9.1 mg/dL (ref 8.9–10.3)
Chloride: 103 mmol/L (ref 98–111)
Creatinine, Ser: 0.96 mg/dL (ref 0.44–1.00)
GFR, Estimated: >60 mL/min (ref >60)
Glucose, Bld: 100 mg/dL — ABNORMAL HIGH (ref 70–99)
Potassium: 3.3 mmol/L — ABNORMAL LOW (ref 3.5–5.1)
Sodium: 138 mmol/L (ref 135–145)

## 2022-11-26 LAB — CBC
HCT: 39.4 % (ref 36.0–46.0)
Hemoglobin: 13.3 g/dL (ref 12.0–15.0)
MCH: 28.5 pg (ref 26.0–34.0)
MCHC: 33.8 g/dL (ref 30.0–36.0)
MCV: 84.5 fL (ref 80.0–100.0)
Platelets: 305 10*3/uL (ref 150–400)
RBC: 4.66 MIL/uL (ref 3.87–5.11)
RDW: 12.2 % (ref 11.5–15.5)
WBC: 6.3 10*3/uL (ref 4.0–10.5)
nRBC: 0 % (ref 0.0–0.2)

## 2022-11-26 LAB — TROPONIN I (HIGH SENSITIVITY): Troponin I (High Sensitivity): <2 ng/L (ref <18)

## 2022-11-26 NOTE — ED Triage Notes (Signed)
Pt sts that she was at work and felt very dizzy like she was going to pass out. Pt sts that she never passed out and that she is feeling much better now.

## 2022-11-26 NOTE — ED Notes (Signed)
First nurse note:  From work via [employer], did asthma inhaler, got dizzy, did not pass out but was found on ground, states got self to ground, did not hit head.   12lead NSR, HR 70, 158/88, 98% on RA, no CBG

## 2022-11-26 NOTE — ED Notes (Signed)
Patient Alert and oriented to baseline. Stable and ambulatory to baseline. Patient verbalized understanding of the discharge instructions.  Patient belongings were taken by the patient.   

## 2022-11-26 NOTE — Discharge Instructions (Addendum)
Your exam, labs, and EKG are normal and reassuring at this time.  No signs of a serious underlying cause for your symptoms.  You should rest and hydrate as necessary.  Follow-up with your primary provider for ongoing concerns.  Return to ED if necessary.

## 2022-11-26 NOTE — ED Provider Notes (Signed)
[facility] Emergency Department Provider Note     Event Date/Time   First MD Initiated Contact with Patient 11/26/22 1230     (approximate)   History   Dizziness   HPI  Audrey Martin is a 62 y.o. female with a history of asthma, HTN, and GERD, presents to the ED via EMS from work.  Patient was at work this morning when she began to experience dizziness, and felt as if she was going to pass out.  Patient denies any syncopal episodes.  She notes onset of her symptoms occurred after she dosed her albuterol rescue inhaler.  She will report that she had recently walked the patient to the front of the office, and wonders if the use of her inhaler without any resting times the source of her symptoms.  She denies any headache, nausea, vomiting, chest pain, shortness of breath.  Patient endorses resolved symptoms at this time.  Physical Exam   Triage Vital Signs: ED Triage Vitals [11/26/22 1104]  Encounter Vitals Group     BP 139/89     Systolic BP Percentile      Diastolic BP Percentile      Pulse Rate 76     Resp 17     Temp 98 F (36.7 C)     Temp Source Oral     SpO2 100 %     Weight 158 lb (71.7 kg)     Height 5\' 1"  (1.549 m)     Head Circumference      Peak Flow      Pain Score 0     Pain Loc      Pain Education      Exclude from Growth Chart     Most recent vital signs: Vitals:   11/26/22 1104 11/26/22 1349  BP: 139/89 131/81  Pulse: 76 77  Resp: 17 16  Temp: 98 F (36.7 C)   SpO2: 100% 98%    General Awake, no distress. NAD HEENT NCAT. PERRL. EOMI. No rhinorrhea. Mucous membranes are moist. CV:  Good peripheral perfusion. RRR RESP:  Normal effort. CTA ABD:  No distention.  NEURO: Cranial nerves II to XII grossly intact   ED Results / Procedures / Treatments   Labs (all labs ordered are listed, but only abnormal results are displayed) Labs Reviewed  BASIC METABOLIC PANEL - Abnormal; Notable for the following components:       Result Value   Potassium 3.3 (*)    Glucose, Bld 100 (*)    All other components within normal limits  CBC  TROPONIN I (HIGH SENSITIVITY)    EKG  Vent. rate 68 BPM PR interval 172 ms QRS duration 78 ms QT/QTcB 432/459 ms P-R-T axes 65 32 59 Normal sinus rhythm Normal ECG  RADIOLOGY  No results found.   PROCEDURES:  Critical Care performed: No  Procedures   MEDICATIONS ORDERED IN ED: Medications - No data to display   IMPRESSION / MDM / ASSESSMENT AND PLAN / ED COURSE  I reviewed the triage vital signs and the nursing notes.                              Differential diagnosis includes, but is not limited to, electrolyte abnormality, vasovagal syncope, cardiac arrhythmia, shortness of breath, dehydration  Patient's presentation is most consistent with acute complicated illness / injury requiring diagnostic workup.  Patient to the ED for evaluation  of a near-syncopal episode at work. She has normal exam at this time. Her work-up is essentially normal at this time. She notes resolution of her symptoms at the time of this disposition. She declined a CXR, denying SOB. Patient's diagnosis is consistent with near-syncope. Patient is to follow up with her PCP as needed or otherwise directed. Patient is given ED precautions to return to the ED for any worsening or new symptoms.   FINAL CLINICAL IMPRESSION(S) / ED DIAGNOSES   Final diagnoses:  Near syncope     Rx / DC Orders   ED Discharge Orders     None        Note:  This document was prepared using Dragon voice recognition software and may include unintentional dictation errors.    Lissa Hoard, PA-C 11/26/22 1936    Minna Antis, MD 11/26/22 1946
